# Patient Record
Sex: Female | Born: 1935 | Race: White | Hispanic: No | Marital: Married
Health system: Southern US, Community
[De-identification: ages and names within clinical notes are randomized; demographics above are authoritative.]

---

## 1999-10-19 ENCOUNTER — Inpatient Hospital Stay (HOSPITAL_COMMUNITY)
Admission: RE | Admit: 1999-10-19 | Discharge: 1999-11-16 | Payer: Self-pay | Admitting: Physical Medicine and Rehabilitation

## 1999-10-20 ENCOUNTER — Encounter: Payer: Self-pay | Admitting: Physical Medicine and Rehabilitation

## 1999-11-03 ENCOUNTER — Encounter: Payer: Self-pay | Admitting: Physical Medicine and Rehabilitation

## 1999-11-10 ENCOUNTER — Encounter: Payer: Self-pay | Admitting: Physical Medicine and Rehabilitation

## 2004-09-07 ENCOUNTER — Ambulatory Visit: Payer: Self-pay | Admitting: Internal Medicine

## 2006-01-12 ENCOUNTER — Ambulatory Visit: Payer: Self-pay | Admitting: Internal Medicine

## 2006-01-13 ENCOUNTER — Other Ambulatory Visit: Payer: Self-pay

## 2006-01-14 ENCOUNTER — Inpatient Hospital Stay: Payer: Self-pay | Admitting: Internal Medicine

## 2006-01-28 ENCOUNTER — Other Ambulatory Visit: Payer: Self-pay

## 2006-01-28 ENCOUNTER — Inpatient Hospital Stay: Payer: Self-pay | Admitting: Internal Medicine

## 2007-06-29 IMAGING — CR DG CHEST 2V
1 series · 2 of 2 positions shown · non-contrast
Comparison: none

REASON FOR EXAM: sob      rm 7
COMMENTS:

[Series 1: view not recorded · 0.17mm/px · 2 of 2 slices shown]
[im 1/2]
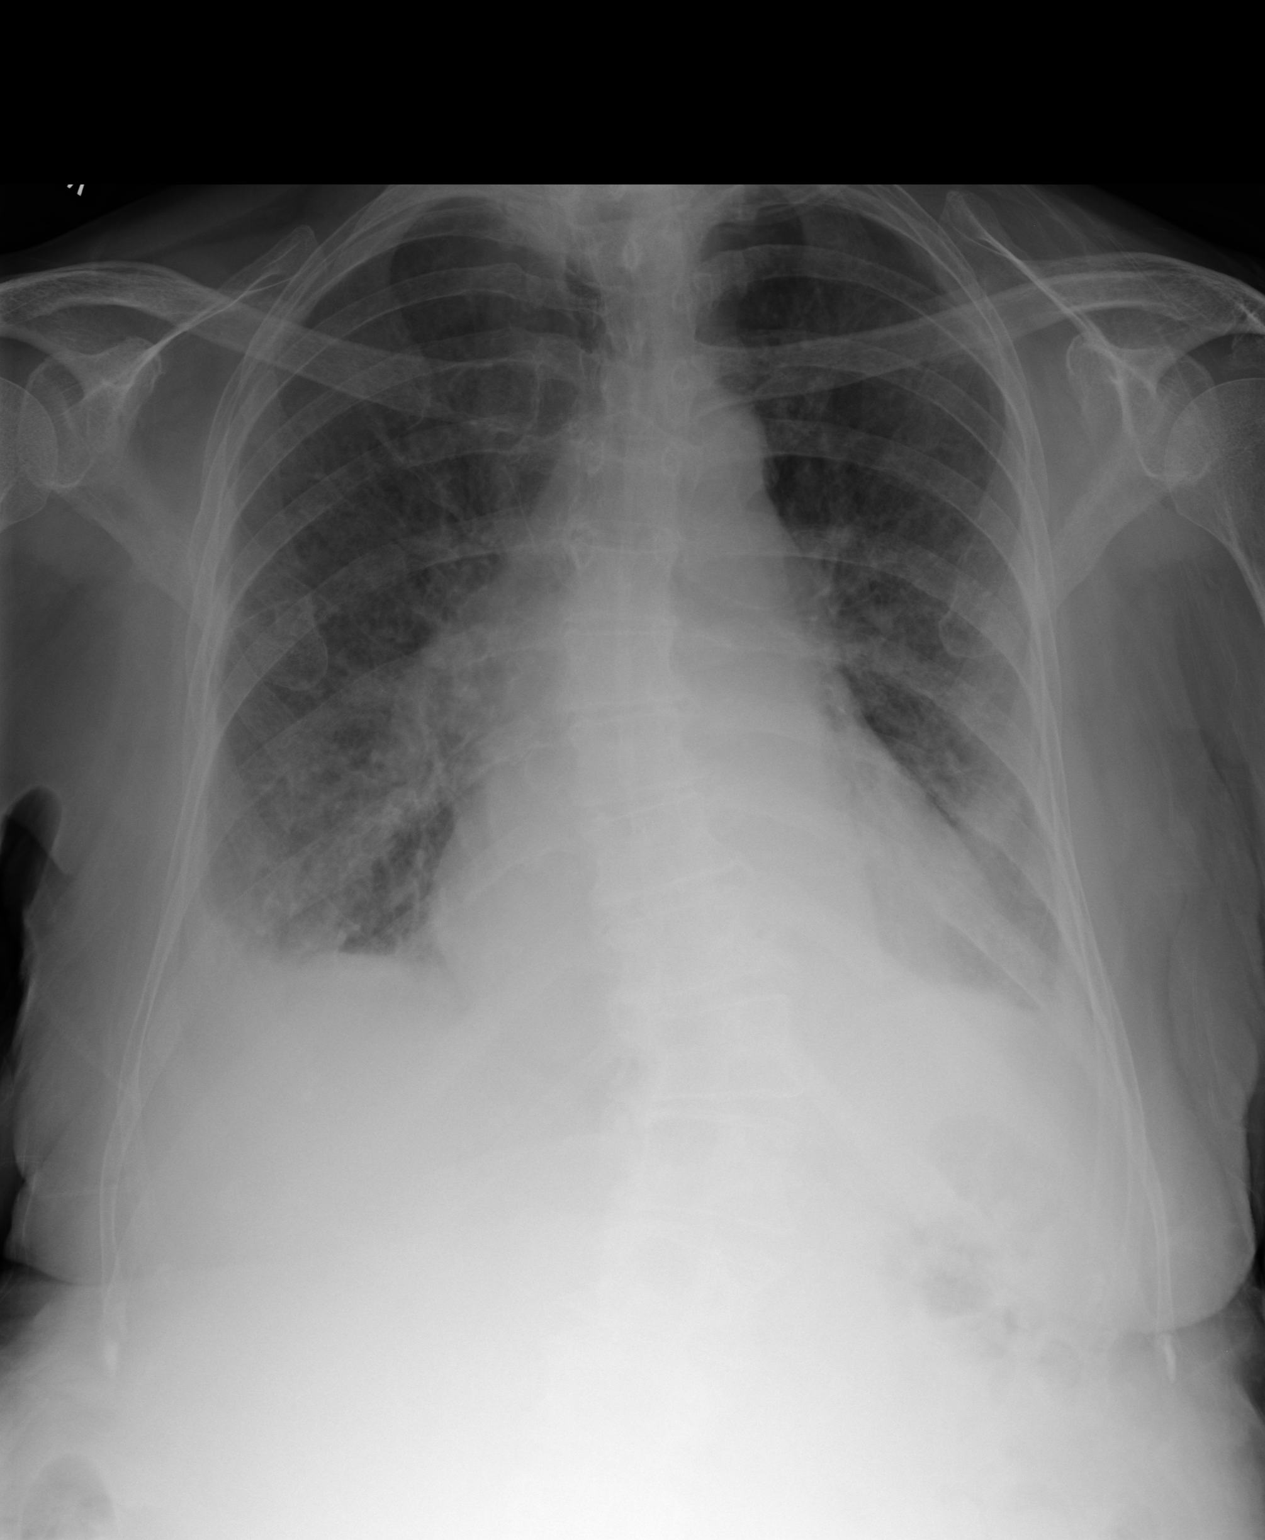
[im 2/2]
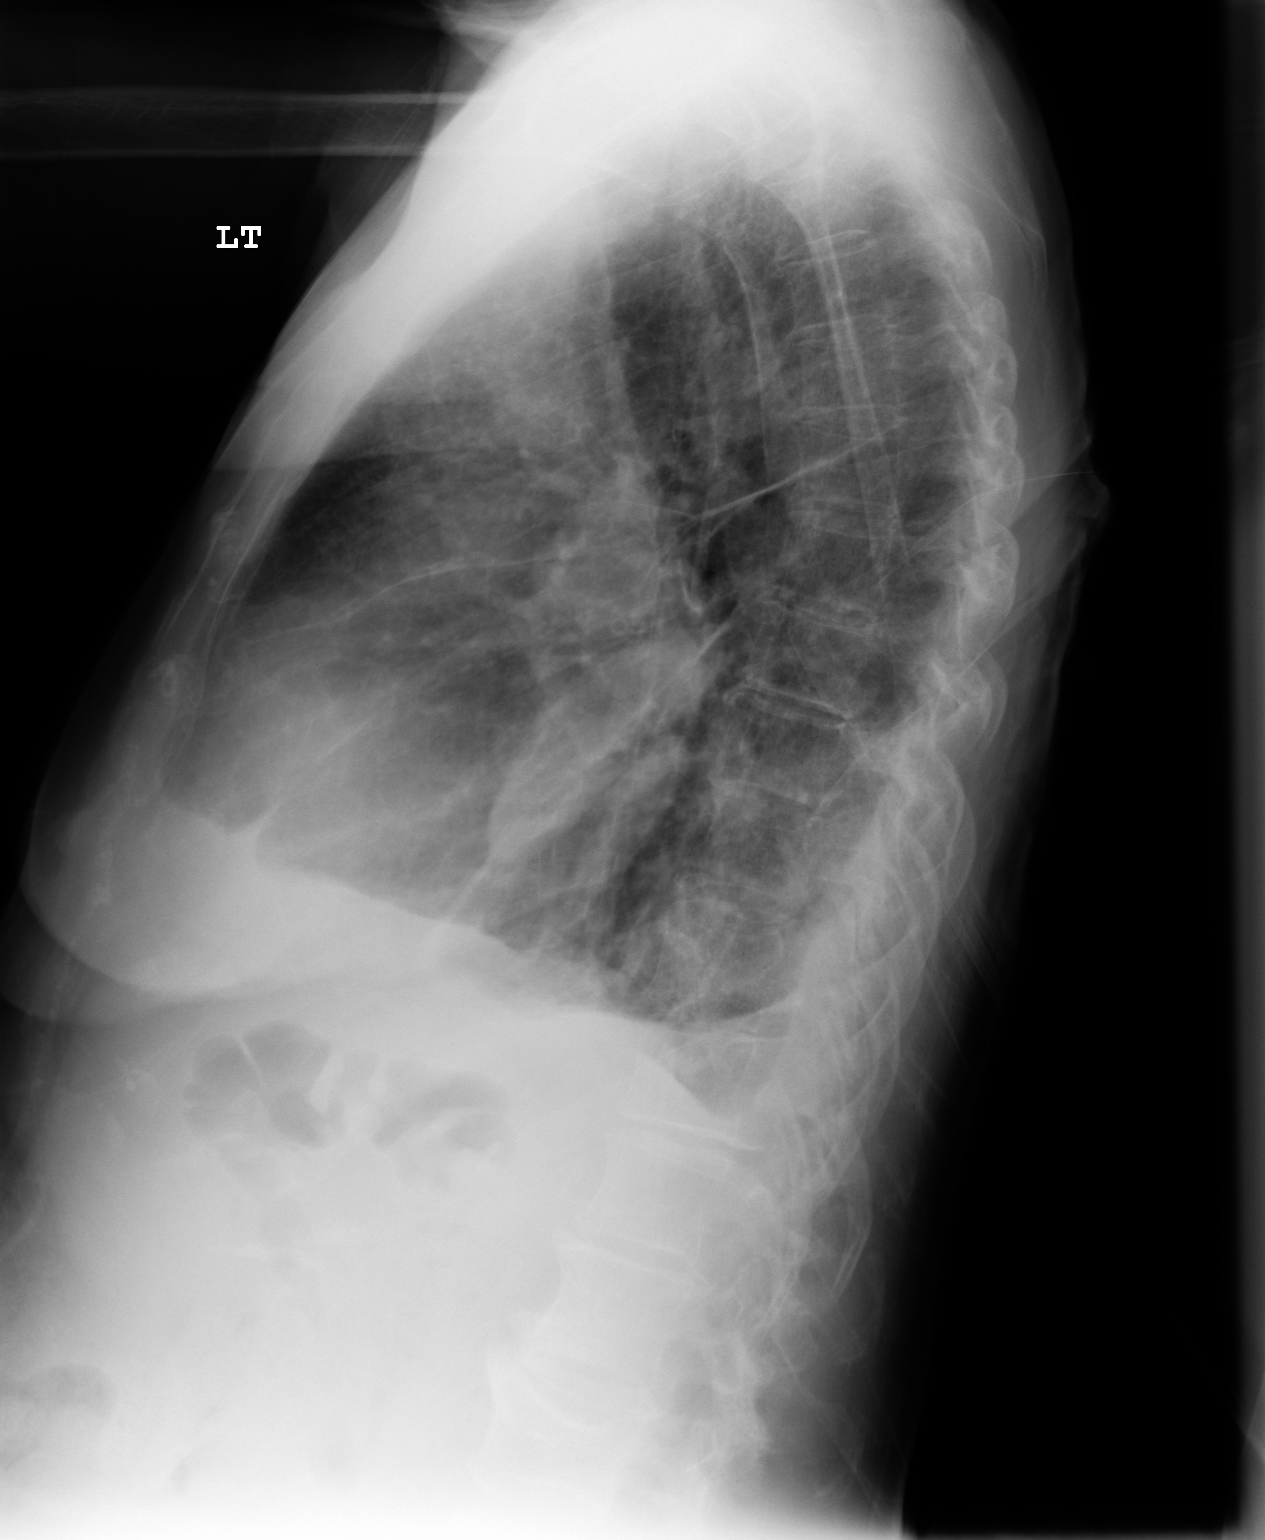

[2 of 2 positions shown; findings below may reference images not displayed]

PROCEDURE:     DXR - DXR CHEST PA (OR AP) AND LATERAL  - January 28, 2006  [DATE]

RESULT:     PA and lateral view were obtained and compared to the prior
study of 01/15/06.  Changes of COPD are noted.  There are seen bilateral
effusions slightly greater on the RIGHT than the LEFT.  The LEFT is
considerably smaller than seen previously.  A minimal density is noted in
the lung bases which could represent infiltrate and/or atelectasis.  The
remainder of the lung fields are clear.  The heart is top normal in size.
There is noted rotoscoliosis of the lower thoracic spine concave to the
RIGHT.
IMPRESSION: Small bilateral effusions.

Changes of COPD.

Density in both lower lobes which may represent atelectasis and/or pneumonia.

## 2007-07-03 IMAGING — CR DG CHEST 1V
1 series · 2 of 2 positions shown · non-contrast
Comparison: none

REASON FOR EXAM: Bilateral decubitus films, right and left
COMMENTS:

PROCEDURE:     DXR - DXR CHEST SPECIFY LAT OR DECUB  - February 01, 2006  [DATE]
RESULT:     RIGHT lateral decubitus views were obtained with RIGHT side
down. There appears to be a small, RIGHT effusion present.

[Series 1: view not recorded · 0.17mm/px · 2 of 2 slices shown]
[im 1/2]
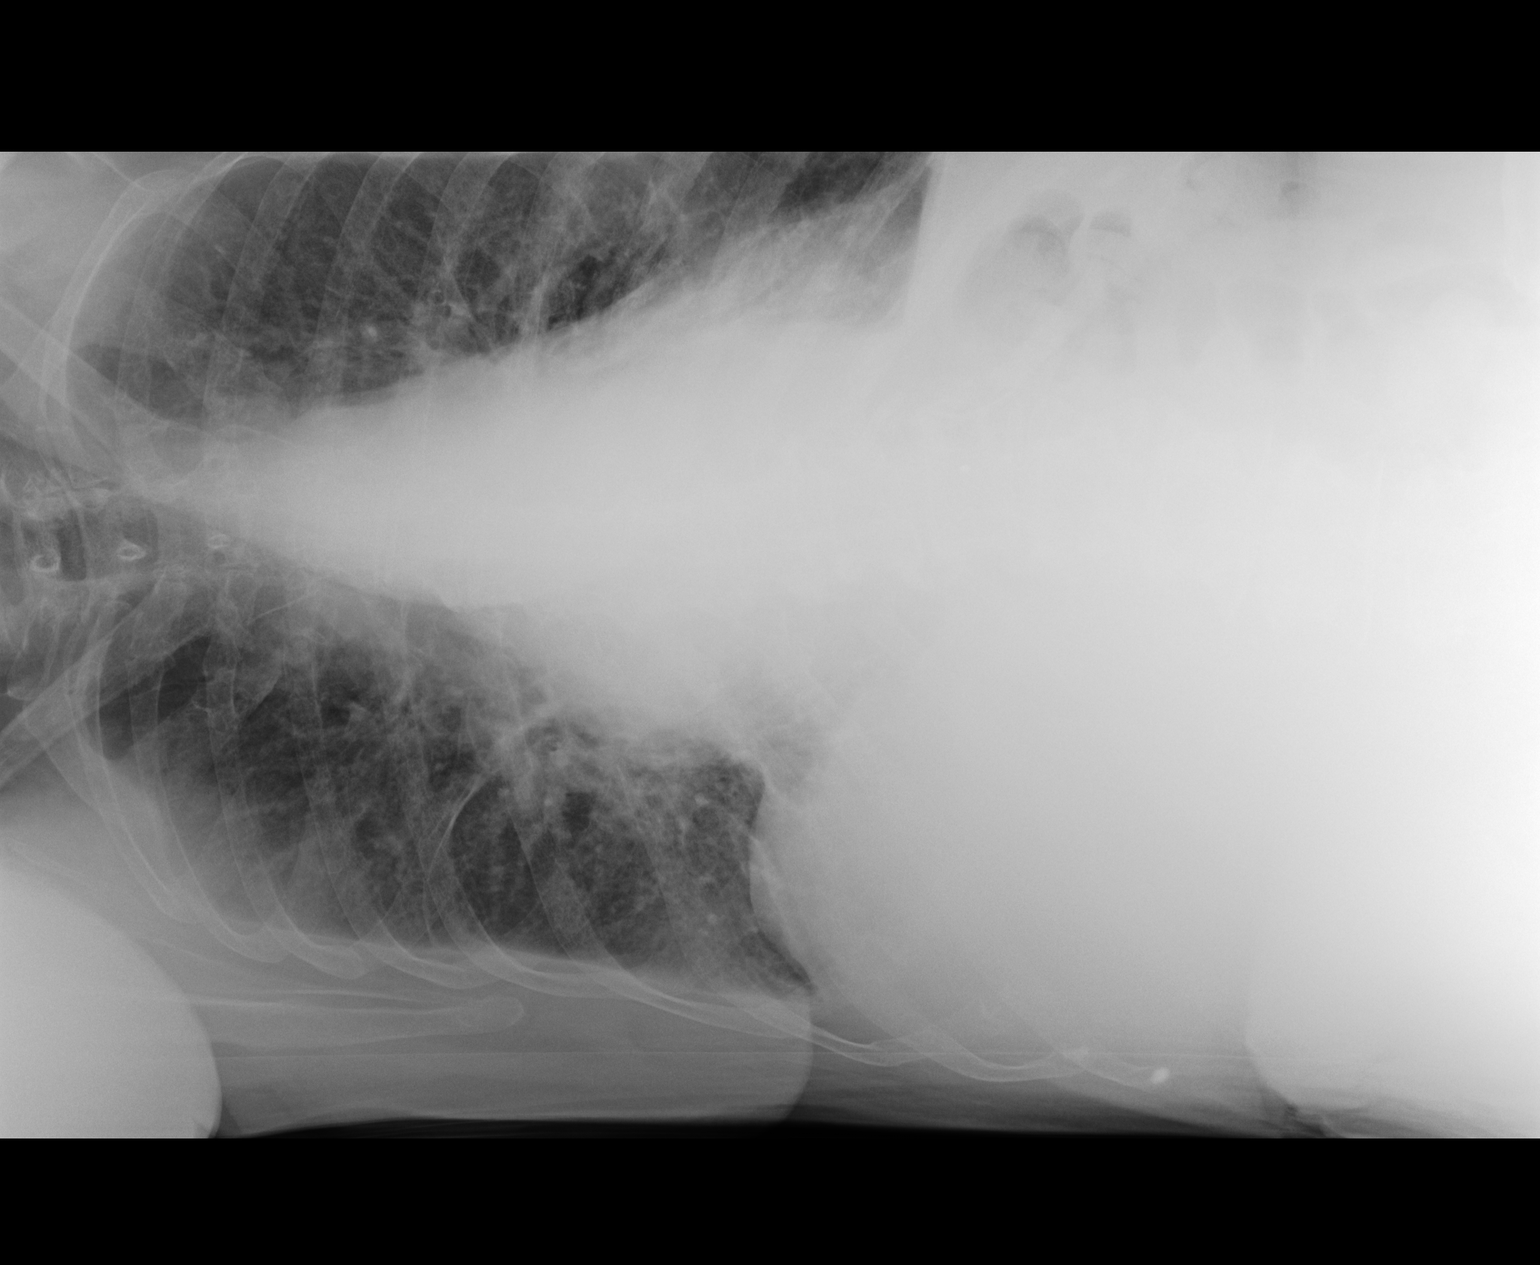
[im 2/2]
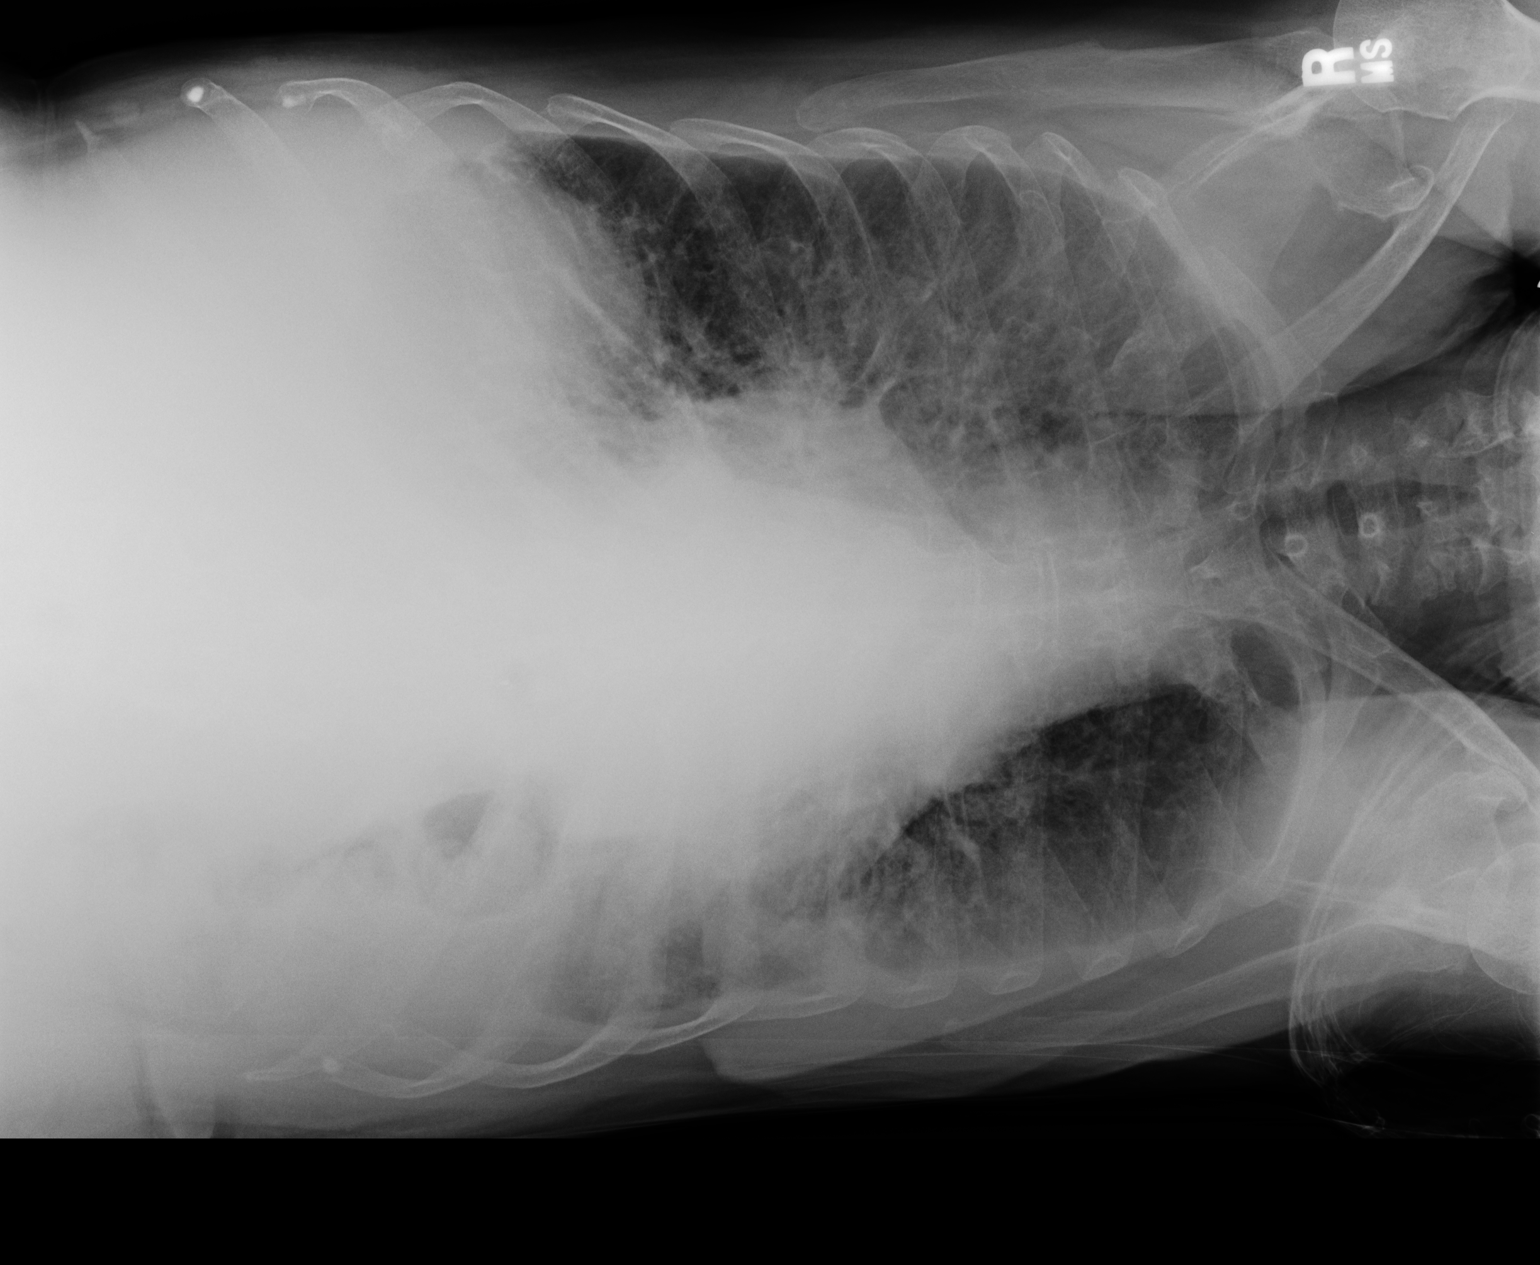

[2 of 2 positions shown; findings below may reference images not displayed]

IMPRESSION: Please see above.

## 2008-03-17 ENCOUNTER — Ambulatory Visit: Payer: Self-pay | Admitting: Internal Medicine

## 2008-03-18 ENCOUNTER — Ambulatory Visit: Payer: Self-pay | Admitting: Internal Medicine

## 2008-03-25 ENCOUNTER — Ambulatory Visit: Payer: Self-pay | Admitting: Anesthesiology

## 2008-06-24 ENCOUNTER — Ambulatory Visit: Payer: Self-pay | Admitting: Physician Assistant

## 2009-12-26 ENCOUNTER — Observation Stay: Payer: Self-pay | Admitting: *Deleted

## 2009-12-30 ENCOUNTER — Encounter: Payer: Self-pay | Admitting: Internal Medicine

## 2010-01-08 ENCOUNTER — Encounter: Payer: Self-pay | Admitting: Internal Medicine

## 2011-03-01 ENCOUNTER — Inpatient Hospital Stay: Payer: Self-pay | Admitting: Internal Medicine

## 2011-03-06 ENCOUNTER — Encounter: Payer: Self-pay | Admitting: Internal Medicine

## 2011-03-11 ENCOUNTER — Encounter: Payer: Self-pay | Admitting: Internal Medicine

## 2012-04-03 ENCOUNTER — Inpatient Hospital Stay: Payer: Self-pay | Admitting: Specialist

## 2012-04-03 LAB — COMPREHENSIVE METABOLIC PANEL
Albumin: 3.4 g/dL (ref 3.4–5.0)
Alkaline Phosphatase: 176 U/L — ABNORMAL HIGH (ref 50–136)
Anion Gap: 8 (ref 7–16)
BUN: 21 mg/dL — ABNORMAL HIGH (ref 7–18)
Bilirubin,Total: 0.4 mg/dL (ref 0.2–1.0)
Calcium, Total: 8.5 mg/dL (ref 8.5–10.1)
Chloride: 97 mmol/L — ABNORMAL LOW (ref 98–107)
Co2: 28 mmol/L (ref 21–32)
Creatinine: 1.02 mg/dL (ref 0.60–1.30)
EGFR (African American): 54 — ABNORMAL LOW
EGFR (Non-African Amer.): 46 — ABNORMAL LOW
Glucose: 124 mg/dL — ABNORMAL HIGH (ref 65–99)
Osmolality: 271 (ref 275–301)
Potassium: 4.5 mmol/L (ref 3.5–5.1)
SGOT(AST): 21 U/L (ref 15–37)
SGPT (ALT): 13 U/L (ref 12–78)
Sodium: 133 mmol/L — ABNORMAL LOW (ref 136–145)
Total Protein: 6.6 g/dL (ref 6.4–8.2)

## 2012-04-03 LAB — CBC
HCT: 38.9 % (ref 35.0–47.0)
HGB: 12.6 g/dL (ref 12.0–16.0)
MCH: 27.7 pg (ref 26.0–34.0)
MCHC: 32.4 g/dL (ref 32.0–36.0)
MCV: 86 fL (ref 80–100)
Platelet: 278 10*3/uL (ref 150–440)
RBC: 4.54 10*6/uL (ref 3.80–5.20)
RDW: 15.7 % — ABNORMAL HIGH (ref 11.5–14.5)
WBC: 12.1 10*3/uL — ABNORMAL HIGH (ref 3.6–11.0)

## 2012-04-03 LAB — TROPONIN I
Troponin-I: <0.02 ng/mL
Troponin-I: <0.02 ng/mL
Troponin-I: <0.02 ng/mL

## 2012-04-03 LAB — CK TOTAL AND CKMB (NOT AT ARMC)
CK, Total: 34 U/L (ref 21–215)
CK-MB: 1.1 ng/mL (ref 0.5–3.6)

## 2012-04-03 LAB — PRO B NATRIURETIC PEPTIDE: B-Type Natriuretic Peptide: 3221 pg/mL — ABNORMAL HIGH (ref 0–450)

## 2012-04-03 LAB — MAGNESIUM: Magnesium: 1.9 mg/dL

## 2012-04-03 LAB — DIGOXIN LEVEL: Digoxin: 1.27 ng/mL

## 2012-04-04 LAB — CBC WITH DIFFERENTIAL/PLATELET
Basophil #: 0 10*3/uL (ref 0.0–0.1)
Eosinophil #: 0.2 10*3/uL (ref 0.0–0.7)
HCT: 33.9 % — ABNORMAL LOW (ref 35.0–47.0)
Lymphocyte #: 0.6 10*3/uL — ABNORMAL LOW (ref 1.0–3.6)
Lymphocyte %: 12.5 %
MCH: 27.6 pg (ref 26.0–34.0)
MCHC: 32.7 g/dL (ref 32.0–36.0)
MCV: 85 fL (ref 80–100)
Neutrophil #: 3.5 10*3/uL (ref 1.4–6.5)
Platelet: 233 10*3/uL (ref 150–440)
RBC: 4.01 10*6/uL (ref 3.80–5.20)
RDW: 15.6 % — ABNORMAL HIGH (ref 11.5–14.5)
WBC: 5.1 10*3/uL (ref 3.6–11.0)

## 2012-04-04 LAB — BASIC METABOLIC PANEL
Anion Gap: 7 (ref 7–16)
Chloride: 98 mmol/L (ref 98–107)
Co2: 31 mmol/L (ref 21–32)
EGFR (Non-African Amer.): 57 — ABNORMAL LOW
Osmolality: 273 (ref 275–301)
Sodium: 136 mmol/L (ref 136–145)

## 2012-04-08 LAB — CULTURE, BLOOD (SINGLE)

## 2013-09-03 IMAGING — CR DG CHEST 1V
1 series · 1 of 1 positions shown · non-contrast
Comparison: none

REASON FOR EXAM: follow up on CHF vs pneumonia
COMMENTS:   LMP: Post-Menopausal

PROCEDURE:     DXR - DXR CHEST 1 VIEWAP OR PA  - April 04, 2012  [DATE]
RESULT:     Comparison: 04/02/2012

[ap]
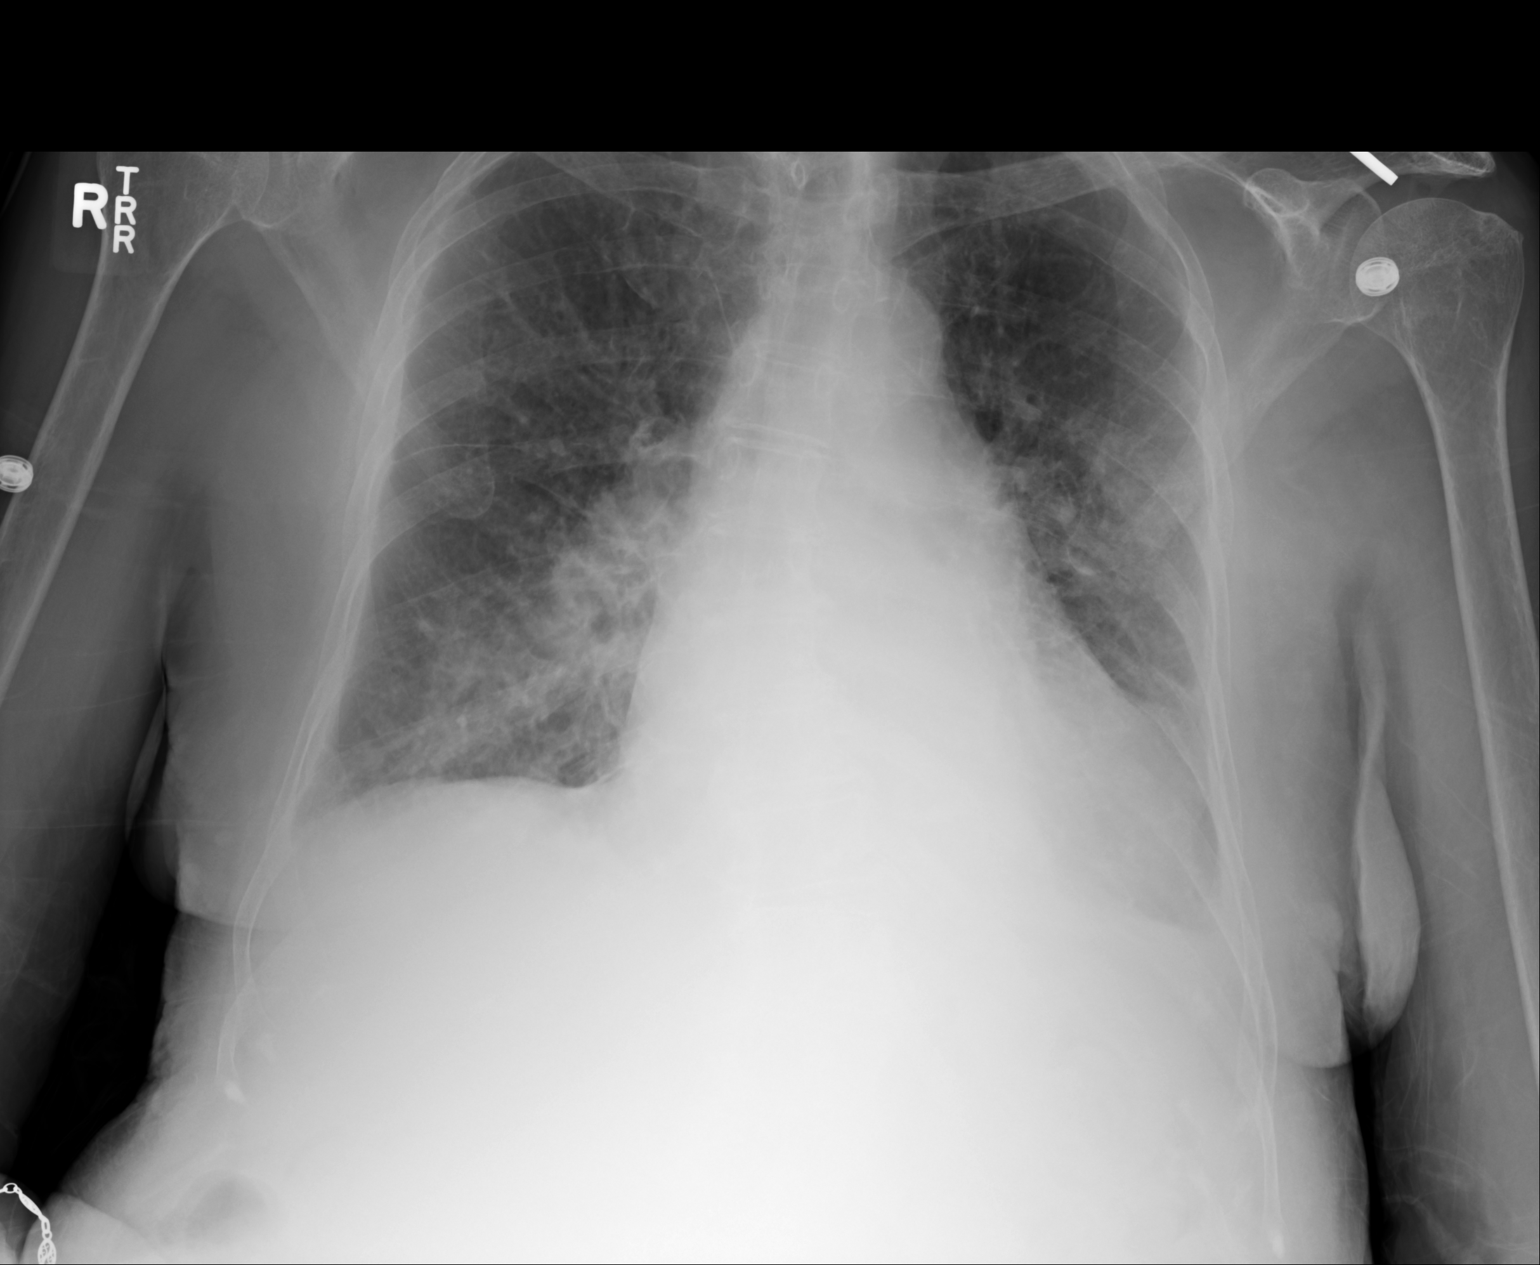

[1 of 1 positions shown; findings below may reference images not displayed]

FINDINGS: Single portable AP chest radiograph is provided. There are bilateral
interstitial and alveolar airspace opacities which may reflect pulmonary
edema versus old lobar pneumonia. There is interval worsening compared with
04/02/2012. There is a small left pleural effusion. Stable cardiomegaly. The
osseous structures are unremarkable.
IMPRESSION: Please see above.

[REDACTED]

## 2014-03-10 DEATH — deceased
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
## 2014-07-28 NOTE — Discharge Summary (Signed)
PATIENT NAME:  Tammy Drake, Tammy Drake MR#:  [id number] DATE OF BIRTH:  Oct 28, 1913  DATE OF ADMISSION:  04/03/2012 DATE OF DISCHARGE:  04/07/2012  For a detailed note, please take a look at the history and physical on admission by Dr. Rudene Rearwish.   DIAGNOSES AT DISCHARGE:  1.  Community acquired pneumonia.  2.  History of chronic diastolic congestive heart failure.  3.  Hyponatremia.  4.  History of chronic atrial fibrillation.   DIET: The patient is being discharged on a low-sodium diet.   ACTIVITY: As tolerated.   FOLLOWUP:  Dr. Alonna BucklerAndrew Lamb in the next 1 to 2 weeks.    DISCHARGE MEDICATIONS: 1.  Digoxin 125 mcg daily.  2.  K-Dur 20 mEq daily.  3.  Lasix 40 mg daily.  4.  Aspirin 81 mg daily.  5.  Cardizem CD 300 mg daily.  6.  Celexa 10 mg daily.  7.  Lutein 20 mg daily.  8.  Levaquin 250 mg daily x 7 days.   DIAGNOSTIC DATA DURING HOSPITAL COURSE: Chest x-ray done on admission showing bilateral interstitial thickening, representing interstitial edema versus interstitial pneumonitis. Blood cultures showing no evidence of any acute growth.   HOSPITAL COURSE: This is a 81 year old female with medical problems, as mentioned above, presented to the hospital on 04/03/2012, secondary to shortness of breath, cough, and hypoxemia. Noted to have a suspected community-acquired pneumonia.    1.  Community-acquired pneumonia. This was likely the cause of the patient's initial symptoms. She presented with shortness of breath, weakness and cough. She was started on Levaquin. Clinical symptoms since then have improved, although she is still somewhat hypoxic on minimal exertion and therefore home oxygen is being arranged for her.  Her white cell count was 12,000 on admission and has come down to 5.  She is afebrile. Her blood cultures have remained negative. She said she clinically feels much better. She is therefore being discharged home.  2.  Chronic atrial fibrillation. The patient remained rate  controlled. She will continue her digoxin and Cardizem.  3.  History of diastolic congestive heart failure. She did not have any evidence of acute decompensated congestive heart failure. She will continue her maintenance Lasix, as mentioned.  4.  Hyponatremia. The patient presented with sodium 133.  Some of this was probably related to her acute pulmonary illness. After getting some gentle IV fluids her hyponatremia since then has resolved and is 136.  5.  The patient is a full code.   DISPOSITION: She is being discharged home with Home Health services. She is being discharged on home oxygen.   Time Spent with discharge: 35 minutes.   ____________________________ Rolly PancakeVivek J. Cherlynn KaiserSainani, MD vjs:eg D: 04/07/2012 13:38:43 ET T: 04/07/2012 23:12:04 ET JOB#: [id number]  cc:  1.  Vivek J. Cherlynn KaiserSainani, MD, <Dictator> 2.  Reola MosherAndrew S. Randa LynnLamb, MD  Houston SirenVIVEK J SAINANI MD ELECTRONICALLY SIGNED 04/08/2012 8:20
